# Patient Record
Sex: Male | Born: 1995 | Race: Black or African American | Hispanic: No | Marital: Single | Smoking: Never smoker
Health system: Southern US, Community
[De-identification: ages and names within clinical notes are randomized; demographics above are authoritative.]

---
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
## 2016-12-17 ENCOUNTER — Emergency Department (HOSPITAL_COMMUNITY): Payer: Self-pay

## 2016-12-17 ENCOUNTER — Encounter (HOSPITAL_COMMUNITY): Payer: Self-pay | Admitting: *Deleted

## 2016-12-17 ENCOUNTER — Emergency Department (HOSPITAL_COMMUNITY)
Admission: EM | Admit: 2016-12-17 | Discharge: 2016-12-17 | Disposition: A | Discharge status: Home / Self Care | Payer: Self-pay | Attending: Emergency Medicine | Admitting: Emergency Medicine

## 2016-12-17 DIAGNOSIS — X501XXA Overexertion from prolonged static or awkward postures, initial encounter: Secondary | ICD-10-CM | POA: Insufficient documentation

## 2016-12-17 DIAGNOSIS — S93105A Unspecified dislocation of left toe(s), initial encounter: Secondary | ICD-10-CM | POA: Insufficient documentation

## 2016-12-17 DIAGNOSIS — Y999 Unspecified external cause status: Secondary | ICD-10-CM | POA: Insufficient documentation

## 2016-12-17 DIAGNOSIS — Y929 Unspecified place or not applicable: Secondary | ICD-10-CM | POA: Insufficient documentation

## 2016-12-17 DIAGNOSIS — Y9367 Activity, basketball: Secondary | ICD-10-CM | POA: Insufficient documentation

## 2016-12-17 MED ORDER — IBUPROFEN 200 MG PO TABS
600.0000 mg | ORAL_TABLET | Freq: Once | ORAL | Status: AC
  Administered 2016-12-17: 600 mg via ORAL
  Filled 2016-12-17: qty 3

## 2016-12-17 NOTE — ED Provider Notes (Signed)
[facility]-EMERGENCY DEPT Provider Note   CSN: [id number] Arrival date & time: 12/17/16  1032  By signing my name below, I, Tyler Branch, attest that this documentation has been prepared under the direction and in the presence of Tyler Branch, [place]. Electronically Signed: Linna Branch, Scribe. 12/17/2016. 12:21 PM.  History   Chief Complaint Chief Complaint  Patient presents with  . Foot Injury   The history is provided by the patient. No language interpreter was used.    HPI Comments: Tyler Branch is a 21 y.o. male who presents to the Emergency Department complaining of constant pain and swelling to the second toe on the left beginning this morning. He states he was playing basketball and landed awkwardly on his left foot without falling. Patient is currently unable to bear weight on his left foot secondary to toe pain. No medications or treatments tried PTA. He denies numbness/tingling, bruising, open wounds, or any other associated symptoms.  History reviewed. No pertinent past medical history.  There are no active problems to display for this patient.   History reviewed. No pertinent surgical history.     Home Medications    Prior to Admission medications   Medication Sig Start Date End Date Taking? Authorizing Provider  ondansetron (ZOFRAN ODT) 8 MG disintegrating tablet Take 1 tablet (8 mg total) by mouth every 8 (eight) hours as needed for nausea. 10/23/12   Tyler Crumble, PA-C    Family History No family history on file.  Social History Social History  Substance Use Topics  . Smoking status: Never Smoker  . Smokeless tobacco: Never Used  . Alcohol use Yes     Allergies   Patient has no known allergies.   Review of Systems Review of Systems  Musculoskeletal: Positive for arthralgias and joint swelling.  Skin: Negative for color change and wound.  Neurological: Negative for numbness.   Physical Exam Updated Vital Signs BP (!) 105/59   Pulse 95    Temp 97.5 F (36.4 C) (Oral)   Resp 18   SpO2 98%   Physical Exam  Constitutional: He appears well-developed and well-nourished. No distress.  Pulmonary/Chest: Effort normal. No respiratory distress.  Musculoskeletal:  Left second toe is obviously deformed.  Intact sensation and circulatory function.  Remainder of foot/ankle is non tender, non swollen with no palpable deformities.   Neurological: He is alert. No sensory deficit.  Skin: Skin is warm and dry. Capillary refill takes less than 2 seconds.  Psychiatric: He has a normal mood and affect. His behavior is normal.  Nursing note and vitals reviewed.  ED Treatments / Results   Radiology Dg Foot Complete Left  Result Date: 12/17/2016 CLINICAL DATA:  Pt landed wrong playing basketball this a.m and felt toe pop out, pain, deformity lt 2nd toe EXAM: LEFT FOOT - COMPLETE 3+ VIEW COMPARISON:  None. FINDINGS: There is no acute fracture. Dorsal dislocation of the left second middle phalanx relative to the left second proximal phalanx. There is no evidence of arthropathy or other focal bone abnormality. Soft tissues are unremarkable. IMPRESSION: Dorsal dislocation of the left second middle phalanx relative to the left second proximal phalanx. Electronically Signed   By: Tyler Branch   On: 12/17/2016 11:53    Procedures Reduction of dislocation Date/Time: 12/17/2016 12:14 PM Performed by: Tyler Branch Authorized by: Tyler Branch  Consent: Verbal consent obtained. Risks and benefits: risks, benefits and alternatives were discussed Consent given by: patient Patient understanding: patient states understanding of the procedure being performed  Patient consent: the patient's understanding of the procedure matches consent given Imaging studies: imaging studies available Patient identity confirmed: verbally with patient and arm band Local anesthesia used: no  Anesthesia: Local anesthesia used: no Patient tolerance: Patient  tolerated the procedure well with no immediate complications Comments: First attempt patient jerked his leg upwards.  Rapidly a second attempt was performed with friend holding his lower leg down.  Rapid success with reduced pain.  Toe appears stable, has full AROM and PROM with out evidence of subluxation or dislocation.  Toe was buddy taped with third toe. Post reduction toe with intact circulatory, sensory and motor function.       COORDINATION OF CARE: 12:13 PM Discussed treatment plan with pt at bedside which involves reduction of a disclocation and pt agreed to plan.  Medications Ordered in ED Medications  ibuprofen (ADVIL,MOTRIN) tablet 600 mg (600 mg Oral Given 12/17/16 1124)     Initial Impression / Assessment and Plan / ED Course  I have reviewed the triage vital signs and the nursing notes.  Pertinent labs & imaging results that were available during my care of the patient were reviewed by me and considered in my medical decision making (see chart for details).    Tyler Branch presents with an acute closed dislocation with out fracture of his left second toe.  Patient consented to a manual closed reduction with out anesthesia which was successfully performed.  Post relocation patient had intact sensation, motor function, circulatory function to the toe which was splinted by buddy taping it.  Patient instructed on ice and otc pain medication as needed, instructed to wear supportive shoes.  Patient was given the option to ask questions, all of which were answered to the best of my ability.     At this time there does not appear to be any evidence of an acute emergency medical condition and the patient appears stable for discharge with appropriate outpatient follow up.Diagnosis was discussed with patient who verbalizes understanding and is agreeable to discharge. Pt case discussed with Tyler Branch who agrees with my plan.    Final Clinical Impressions(s) / ED Diagnoses   Final  diagnoses:  Closed dislocation of second toe of left foot, initial encounter    New Prescriptions Discharge Medication List as of 12/17/2016 12:25 PM     I personally performed the services described in this documentation, which was scribed in my presence. The recorded information has been reviewed and is accurate.    Tyler GongHammond, Damoni Causby W, PA-C 12/17/16 1905    Melene PlanFloyd, Dan, DO 12/18/16 [phone]

## 2016-12-17 NOTE — ED Notes (Signed)
Bed: WTR8 Expected date:  Expected time:  Means of arrival:  Comments: 

## 2016-12-17 NOTE — Discharge Instructions (Signed)
Please take Ibuprofen (Advil, motrin) and Tylenol (acetaminophen) to relieve your pain.  You may take up to 800 MG (4 pills) of normal strength ibuprofen every 8 hours as needed.  In between doses of ibuprofen you make take tylenol, up to 1,000 mg (two extra strength pills).  Do not take more than 3,000 mg tylenol in a 24 hour period.  Please check all medication labels as many medications such as pain and cold medications may contain tylenol.  Do not drink alcohol while taking these medications.  Do not take other NSAID'S while taking ibuprofen (such as aleve or naproxen).  Please take ibuprofen with food to decrease stomach upset.  Please wear supportive shoes.  You may resume activities as your pain allows.  Please make sure to wear supportive shoes while playing sports.

## 2016-12-17 NOTE — ED Triage Notes (Signed)
Pt complains of left second toe pain since jumping and landing on his foot while playing basketball today. Pt has deformity to left second toe.

## 2017-07-18 ENCOUNTER — Ambulatory Visit (HOSPITAL_COMMUNITY)
Admission: EM | Admit: 2017-07-18 | Discharge: 2017-07-18 | Disposition: A | Discharge status: Home / Self Care | Payer: Self-pay | Attending: Family Medicine | Admitting: Family Medicine

## 2017-07-18 ENCOUNTER — Other Ambulatory Visit: Payer: Self-pay

## 2017-07-18 ENCOUNTER — Ambulatory Visit (INDEPENDENT_AMBULATORY_CARE_PROVIDER_SITE_OTHER): Payer: Self-pay

## 2017-07-18 ENCOUNTER — Encounter (HOSPITAL_COMMUNITY): Payer: Self-pay | Admitting: *Deleted

## 2017-07-18 DIAGNOSIS — M25512 Pain in left shoulder: Secondary | ICD-10-CM

## 2017-07-18 MED ORDER — DICLOFENAC SODIUM 75 MG PO TBEC: 75.0000 mg | DELAYED_RELEASE_TABLET | Freq: Two times a day (BID) | ORAL | 0 refills | Status: AC
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
## 2022-07-01 ENCOUNTER — Telehealth: Payer: Self-pay

## 2022-07-01 NOTE — Telephone Encounter (Signed)
LVM. AS, CMA
